# Patient Record
Sex: Female | Born: 1955 | Race: White | Hispanic: No | Marital: Married | Smoking: Former smoker
Health system: Southern US, Community
[De-identification: ages and names within clinical notes are randomized; demographics above are authoritative.]

## PROBLEM LIST (undated history)

## (undated) DIAGNOSIS — F419 Anxiety disorder, unspecified: Secondary | ICD-10-CM

## (undated) DIAGNOSIS — E7800 Pure hypercholesterolemia, unspecified: Secondary | ICD-10-CM

## (undated) DIAGNOSIS — K219 Gastro-esophageal reflux disease without esophagitis: Secondary | ICD-10-CM

## (undated) DIAGNOSIS — G43909 Migraine, unspecified, not intractable, without status migrainosus: Secondary | ICD-10-CM

## (undated) DIAGNOSIS — G4700 Insomnia, unspecified: Secondary | ICD-10-CM

## (undated) DIAGNOSIS — E538 Deficiency of other specified B group vitamins: Secondary | ICD-10-CM

## (undated) DIAGNOSIS — R739 Hyperglycemia, unspecified: Secondary | ICD-10-CM

## (undated) DIAGNOSIS — F32A Depression, unspecified: Secondary | ICD-10-CM

## (undated) DIAGNOSIS — E119 Type 2 diabetes mellitus without complications: Secondary | ICD-10-CM

## (undated) DIAGNOSIS — F329 Major depressive disorder, single episode, unspecified: Secondary | ICD-10-CM

## (undated) DIAGNOSIS — A6000 Herpesviral infection of urogenital system, unspecified: Secondary | ICD-10-CM

## (undated) HISTORY — PX: CHOLECYSTECTOMY: SHX55

## (undated) HISTORY — PX: TUBAL LIGATION: SHX77

## (undated) HISTORY — PX: TONSILLECTOMY: SUR1361

## (undated) HISTORY — PX: APPENDECTOMY: SHX54

---

## 2004-07-01 ENCOUNTER — Other Ambulatory Visit: Admission: RE | Admit: 2004-07-01 | Discharge: 2004-07-01 | Payer: Self-pay | Admitting: Family Medicine

## 2004-09-05 ENCOUNTER — Encounter: Admission: RE | Admit: 2004-09-05 | Discharge: 2004-10-12 | Payer: Self-pay | Admitting: Orthopedic Surgery

## 2005-10-09 ENCOUNTER — Emergency Department: Payer: Self-pay | Admitting: Emergency Medicine

## 2006-10-01 ENCOUNTER — Other Ambulatory Visit: Admission: RE | Admit: 2006-10-01 | Discharge: 2006-10-01 | Payer: Self-pay | Admitting: Family Medicine

## 2006-10-15 ENCOUNTER — Emergency Department: Payer: Self-pay | Admitting: General Practice

## 2007-09-17 ENCOUNTER — Encounter: Admission: RE | Admit: 2007-09-17 | Discharge: 2007-09-17 | Payer: Self-pay | Admitting: Family Medicine

## 2007-10-14 ENCOUNTER — Other Ambulatory Visit: Admission: RE | Admit: 2007-10-14 | Discharge: 2007-10-14 | Payer: Self-pay | Admitting: Family Medicine

## 2008-08-20 ENCOUNTER — Ambulatory Visit: Payer: Self-pay | Admitting: Family Medicine

## 2008-10-19 ENCOUNTER — Ambulatory Visit: Payer: Self-pay | Admitting: Nurse Practitioner

## 2009-01-29 ENCOUNTER — Emergency Department: Payer: Self-pay

## 2009-08-24 ENCOUNTER — Ambulatory Visit: Payer: Self-pay | Admitting: Nurse Practitioner

## 2009-08-26 ENCOUNTER — Ambulatory Visit: Payer: Self-pay | Admitting: Nurse Practitioner

## 2009-10-19 ENCOUNTER — Ambulatory Visit: Payer: Self-pay | Admitting: Nurse Practitioner

## 2010-08-05 ENCOUNTER — Ambulatory Visit: Payer: Self-pay | Admitting: Sports Medicine

## 2010-09-19 ENCOUNTER — Ambulatory Visit: Payer: Self-pay | Admitting: General Practice

## 2010-10-03 ENCOUNTER — Inpatient Hospital Stay: Payer: Self-pay | Admitting: General Practice

## 2010-10-04 LAB — PATHOLOGY REPORT

## 2011-01-17 ENCOUNTER — Ambulatory Visit: Payer: Self-pay | Admitting: Family Medicine

## 2011-02-23 ENCOUNTER — Ambulatory Visit: Payer: Self-pay | Admitting: Gastroenterology

## 2018-08-31 ENCOUNTER — Encounter: Payer: Self-pay | Admitting: Gynecology

## 2018-08-31 ENCOUNTER — Ambulatory Visit (INDEPENDENT_AMBULATORY_CARE_PROVIDER_SITE_OTHER): Payer: Managed Care, Other (non HMO)

## 2018-08-31 ENCOUNTER — Other Ambulatory Visit: Payer: Self-pay

## 2018-08-31 ENCOUNTER — Ambulatory Visit
Admission: EM | Admit: 2018-08-31 | Discharge: 2018-08-31 | Disposition: A | Discharge status: Home / Self Care | Payer: Managed Care, Other (non HMO) | Attending: Family Medicine | Admitting: Family Medicine

## 2018-08-31 DIAGNOSIS — R220 Localized swelling, mass and lump, head: Secondary | ICD-10-CM

## 2018-08-31 DIAGNOSIS — W01118A Fall on same level from slipping, tripping and stumbling with subsequent striking against other sharp object, initial encounter: Secondary | ICD-10-CM

## 2018-08-31 DIAGNOSIS — Z23 Encounter for immunization: Secondary | ICD-10-CM | POA: Diagnosis not present

## 2018-08-31 DIAGNOSIS — S0990XA Unspecified injury of head, initial encounter: Secondary | ICD-10-CM

## 2018-08-31 DIAGNOSIS — S0181XA Laceration without foreign body of other part of head, initial encounter: Secondary | ICD-10-CM | POA: Diagnosis not present

## 2018-08-31 DIAGNOSIS — S0083XA Contusion of other part of head, initial encounter: Secondary | ICD-10-CM

## 2018-08-31 HISTORY — DX: Major depressive disorder, single episode, unspecified: F32.9

## 2018-08-31 HISTORY — DX: Deficiency of other specified B group vitamins: E53.8

## 2018-08-31 HISTORY — DX: Gastro-esophageal reflux disease without esophagitis: K21.9

## 2018-08-31 HISTORY — DX: Type 2 diabetes mellitus without complications: E11.9

## 2018-08-31 HISTORY — DX: Insomnia, unspecified: G47.00

## 2018-08-31 HISTORY — DX: Pure hypercholesterolemia, unspecified: E78.00

## 2018-08-31 HISTORY — DX: Herpesviral infection of urogenital system, unspecified: A60.00

## 2018-08-31 HISTORY — DX: Anxiety disorder, unspecified: F41.9

## 2018-08-31 HISTORY — DX: Hyperglycemia, unspecified: R73.9

## 2018-08-31 HISTORY — DX: Migraine, unspecified, not intractable, without status migrainosus: G43.909

## 2018-08-31 HISTORY — DX: Depression, unspecified: F32.A

## 2018-08-31 MED ORDER — CEPHALEXIN 500 MG PO CAPS: 500.0000 mg | ORAL_CAPSULE | Freq: Three times a day (TID) | ORAL | 0 refills | Status: AC

## 2018-08-31 MED ORDER — LIDOCAINE-EPINEPHRINE (PF) 1 %-1:200000 IJ SOLN: 10.0000 mL | Freq: Once | INTRAMUSCULAR | Status: DC

## 2018-08-31 MED ORDER — MUPIROCIN 2 % EX OINT: TOPICAL_OINTMENT | CUTANEOUS | 0 refills | Status: AC

## 2018-08-31 MED ORDER — TETANUS-DIPHTH-ACELL PERTUSSIS 5-2.5-18.5 LF-MCG/0.5 IM SUSP
0.5000 mL | Freq: Once | INTRAMUSCULAR | Status: AC
  Administered 2018-08-31: 0.5 mL via INTRAMUSCULAR

## 2018-08-31 NOTE — Discharge Instructions (Signed)
Take medication as prescribed. Rest. Drink plenty of fluids. Keep clean.   Suture removal in 7 days.  Follow up with your primary care physician this week as needed. Return to Urgent care for new or worsening concerns.

## 2018-08-31 NOTE — ED Provider Notes (Signed)
[facility] ____________________________________________  Time seen: Approximately 12:27 PM  I have reviewed the triage vital signs and the nursing notes.   HISTORY  Chief Complaint Head Laceration   HPI Betty Barajas is a 62 y.o. female presenting for evaluation of right forehead laceration and headache post head trauma that occurred just prior to arrival.  Patient reports that she was outside doing yard work, but accidentally tripped over a root in the yard causing her to fall forward.  States that the shovel that was sitting in the grass was sitting with blade up, and states that she struck her forehead on the blade.  Denies loss of consciousness.  Reports that she has had a headache 6-7 out of 10 since.  No accompanying vision changes, dizziness, syncope, near-syncope, vomiting, neck pain, back pain, extremity pain or injuries, chest pain, shortness of breath or other complaints.  States that she fell only because of a tripping injury.  States last tetanus immunization approximately 4 years ago.  No medicine taken prior to arrival.  Denies other aggravating alleviating factors.  Reports otherwise feels well. Denies recent sickness. Denies recent antibiotic use.   Rayetta HumphreyGeorge, Sionne A, MD: PCP   Past Medical History:  Diagnosis Date  . Anxiety   . B12 deficiency   . Depression   . Diabetes mellitus without complication (HCC)   . GERD (gastroesophageal reflux disease)   . Herpes genitalis   . Hypercholesteremia   . Hyperglycemia   . Insomnia   . Migraine     There are no active problems to display for this patient.   Past Surgical History:  Procedure Laterality Date  . APPENDECTOMY    . CESAREAN SECTION    . CHOLECYSTECTOMY    . TONSILLECTOMY    . TUBAL LIGATION        Current Facility-Administered Medications:  .  lidocaine-EPINEPHrine (XYLOCAINE-EPINEPHrine) 1 %-1:200000 (PF) injection 10 mL, 10 mL, Other, Once, Renford DillsMiller, Bluford Sedler, NP  Current Outpatient  Medications:  .  buPROPion (WELLBUTRIN XL) 300 MG 24 hr tablet, Take by mouth., Disp: , Rfl:  .  fenofibrate (TRICOR) 48 MG tablet, Take by mouth., Disp: , Rfl:  .  Meclizine HCl 25 MG CHEW, Chew by mouth., Disp: , Rfl:  .  metFORMIN (GLUCOPHAGE) 500 MG tablet, Take by mouth., Disp: , Rfl:  .  sertraline (ZOLOFT) 100 MG tablet, Take by mouth., Disp: , Rfl:  .  SUMAtriptan (IMITREX) 100 MG tablet, TAKE 1 TABLET ONCE AS NEEDED FOR MIGRAINE FOR UP TO 1 DOSE, MAY TAKE A SECOND DOSE AFTER 2 HOURS IF NEEDED, Disp: , Rfl:  .  valACYclovir (VALTREX) 1000 MG tablet, Take by mouth., Disp: , Rfl:  .  zolpidem (AMBIEN) 5 MG tablet, Take by mouth., Disp: , Rfl:  .  cephALEXin (KEFLEX) 500 MG capsule, Take 1 capsule (500 mg total) by mouth 3 (three) times daily for 7 days., Disp: 21 capsule, Rfl: 0 .  mupirocin ointment (BACTROBAN) 2 %, Apply twice times a day for 7 days., Disp: 22 g, Rfl: 0  Allergies Lovastatin; Baclofen; and Codeine  Family History  Adopted: Yes    Social History Social History   Tobacco Use  . Smoking status: Former Games developermoker  . Smokeless tobacco: Never Used  Substance Use Topics  . Alcohol use: Yes  . Drug use: Never    Review of Systems Constitutional: No fever/chills Eyes: No visual changes. Cardiovascular: Denies chest pain. Respiratory: Denies shortness of breath. Gastrointestinal: No abdominal pain.  No nausea, no vomiting.   Musculoskeletal: Negative for back pain. Skin: Positive laceration. Neurological: Negative for  focal weakness or numbness.  Positive headache.   ____________________________________________   PHYSICAL EXAM:  VITAL SIGNS: ED Triage Vitals  Enc Vitals Group     BP 08/31/18 1154 (!) 141/87     Pulse Rate 08/31/18 1154 74     Resp 08/31/18 1154 16     Temp 08/31/18 1154 98.3 F (36.8 C)     Temp Source 08/31/18 1154 Oral     SpO2 08/31/18 1154 96 %     Weight 08/31/18 1155 161 lb (73 kg)     Height 08/31/18 1155 5\' 6"  (1.676 m)      Head Circumference --      Peak Flow --      Pain Score 08/31/18 1155 8     Pain Loc --      Pain Edu? --      Excl. in GC? --     Constitutional: Alert and oriented. Well appearing and in no acute distress. Eyes: Conjunctivae are normal. PERRL. EOMI. no pain with EOMs. ENT      Head: see skin below.      Nose: No congestion/rhinnorhea.      Mouth/Throat: Mucous membranes are moist.Oropharynx non-erythematous.  No dental injury noted. Neck: No stridor. Supple without meningismus.  Hematological/Lymphatic/Immunilogical: No cervical lymphadenopathy. Cardiovascular: Normal rate, regular rhythm. Grossly normal heart sounds.  Good peripheral circulation. Respiratory: Normal respiratory effort without tachypnea nor retractions. Breath sounds are clear and equal bilaterally. No wheezes, rales, rhonchi. Musculoskeletal:  Nontender with normal range of motion in all extremities. No midline cervical, thoracic or lumbar tenderness to palpation. Neurologic:  Normal speech and language. No gross focal neurologic deficits are appreciated. Speech is normal. No gait instability.  No paresthesias.  Steady gait. Skin:  Skin is warm, dry and intact. No rash noted. Except:   3 cm laceration, with 1 cm laceration on each bottom side as depicted above, mild to moderate tenderness to direct palpation, no further surrounding tenderness, no periorbital tenderness, no facial tenderness, mild active bleeding, no foreign body noted, no galeal laceration noted.   Post repair  Psychiatric: Mood and affect are normal. Speech and behavior are normal. Patient exhibits appropriate insight and judgment   ___________________________________________   LABS (all labs ordered are listed, but only abnormal results are displayed)  Labs Reviewed - No data to display  RADIOLOGY  Ct Head Wo Contrast  Result Date: 08/31/2018 CLINICAL DATA:  62 year old female with acute head injury with headache and laceration above  the RIGHT eye. EXAM: CT HEAD WITHOUT CONTRAST TECHNIQUE: Contiguous axial images were obtained from the base of the skull through the vertex without intravenous contrast. COMPARISON:  10/15/2006 CT FINDINGS: Brain: No evidence of acute infarction, hemorrhage, hydrocephalus, extra-axial collection or mass lesion/mass effect. Vascular: Mild atherosclerotic calcifications noted. Skull: Normal. Negative for fracture or focal lesion. Sinuses/Orbits: No acute finding. Other: RIGHT forehead soft tissue swelling noted. IMPRESSION: 1. No evidence of acute intracranial abnormality. 2. RIGHT forehead soft tissue swelling without fracture. Electronically Signed   By: Harmon Pier M.D.   On: 08/31/2018 12:50   ____________________________________________   PROCEDURES Procedures   Procedure(s) performed:  Procedure explained and verbal consent obtained. Consent: Verbal consent obtained. Written consent not obtained. Risks and benefits: risks, benefits and alternatives were discussed Patient identity confirmed: verbally with patient and hospital-assigned identification number  Consent given by: patient   Laceration Repair Location:right forehead  Length: 5 cm total Foreign bodies: no foreign bodies Tendon involvement: none Nerve involvement: none Preparation: Patient was prepped and draped in the usual sterile fashion. Anesthesia with topical LET, 1% Lidocaine with epi 4 mls Cleaned with Betadine. Irrigation solution: saline Irrigation method: jet lavage Amount of cleaning: copious Repaired with 6-0 nylon Number of sutures: 9 Technique: simple interrupted  Approximation: loose Patient tolerate well. Wound well approximated post repair.  Antibiotic ointment and dressing applied.  Wound care instructions provided.  Observe for any signs of infection or other problems.      INITIAL IMPRESSION / ASSESSMENT AND PLAN / ED COURSE  Pertinent labs & imaging results that were available during my care of  the patient were reviewed by me and considered in my medical decision making (see chart for details).  Well-appearing patient.  No focal neurological deficit.  Due to mechanism, CT head evaluated, CT head as above per radiologist, no evidence of acute intracranial abnormality, right forehead soft tissue swelling without fracture.  Laceration copiously cleaned and irrigated and repaired as above.  Due to dirty object, Tdap updated.  Patient also "prediabetic "and due to mechanism will empirically also treat with oral Keflex and topical Bactroban.  Encouraged ice, over-the-counter Tylenol, rest and supportive care.  Suture removal in 7 days.  Return to urgent care or primary care for suture removal.  Discussed sooner return parameters and wound care.Discussed indication, risks and benefits of medications with patient.  Discussed follow up with Primary care physician this week. Discussed follow up and return parameters including no resolution or any worsening concerns. Patient verbalized understanding and agreed to plan.   ____________________________________________   FINAL CLINICAL IMPRESSION(S) / ED DIAGNOSES  Final diagnoses:  Laceration of forehead, initial encounter  Traumatic hematoma of forehead, initial encounter     ED Discharge Orders         Ordered    cephALEXin (KEFLEX) 500 MG capsule  3 times daily     08/31/18 1401    mupirocin ointment (BACTROBAN) 2 %     08/31/18 1401           Note: This dictation was prepared with Dragon dictation along with smaller phrase technology. Any transcriptional errors that result from this process are unintentional.         Renford Dills, NP 08/31/18 1624

## 2018-08-31 NOTE — ED Triage Notes (Signed)
Per patient fell in her back yard x today when her forehead it on the shovel. Patient denies loss of consciousness. Per patient with headache and laceration to her forehead

## 2018-10-24 ENCOUNTER — Other Ambulatory Visit: Payer: Self-pay | Admitting: Family Medicine

## 2018-10-24 DIAGNOSIS — Z1231 Encounter for screening mammogram for malignant neoplasm of breast: Secondary | ICD-10-CM

## 2018-12-16 ENCOUNTER — Encounter (INDEPENDENT_AMBULATORY_CARE_PROVIDER_SITE_OTHER): Payer: Self-pay

## 2018-12-16 ENCOUNTER — Ambulatory Visit
Admission: RE | Admit: 2018-12-16 | Discharge: 2018-12-16 | Disposition: A | Discharge status: Home / Self Care | Payer: Managed Care, Other (non HMO) | Source: Ambulatory Visit | Attending: Family Medicine | Admitting: Family Medicine

## 2018-12-16 DIAGNOSIS — Z1231 Encounter for screening mammogram for malignant neoplasm of breast: Secondary | ICD-10-CM | POA: Diagnosis present

## 2020-09-22 LAB — EXTERNAL GENERIC LAB PROCEDURE: COLOGUARD: NEGATIVE

## 2020-12-13 ENCOUNTER — Other Ambulatory Visit: Payer: Self-pay | Admitting: Family Medicine

## 2020-12-13 DIAGNOSIS — Z1231 Encounter for screening mammogram for malignant neoplasm of breast: Secondary | ICD-10-CM

## 2021-09-29 ENCOUNTER — Ambulatory Visit: Payer: Managed Care, Other (non HMO)

## 2022-01-04 ENCOUNTER — Other Ambulatory Visit: Payer: Self-pay | Admitting: Family Medicine

## 2022-01-04 ENCOUNTER — Other Ambulatory Visit: Payer: Self-pay

## 2022-01-04 ENCOUNTER — Ambulatory Visit
Admission: RE | Admit: 2022-01-04 | Discharge: 2022-01-04 | Disposition: A | Discharge status: Home / Self Care | Payer: 59 | Source: Ambulatory Visit | Attending: Family Medicine | Admitting: Family Medicine

## 2022-01-04 DIAGNOSIS — Z1231 Encounter for screening mammogram for malignant neoplasm of breast: Secondary | ICD-10-CM | POA: Insufficient documentation

## 2022-09-09 IMAGING — MG MM DIGITAL SCREENING BILAT W/ TOMO AND CAD
8 series · 8 of 24 positions shown · non-contrast
Comparison: Previous exam(s).

CLINICAL DATA: Screening.

EXAM:
DIGITAL SCREENING BILATERAL MAMMOGRAM WITH TOMOSYNTHESIS AND CAD
TECHNIQUE: Bilateral screening digital craniocaudal and mediolateral oblique
mammograms were obtained. Bilateral screening digital breast
tomosynthesis was performed. The images were evaluated with
computer-aided detection.

[R CC synth-2D]
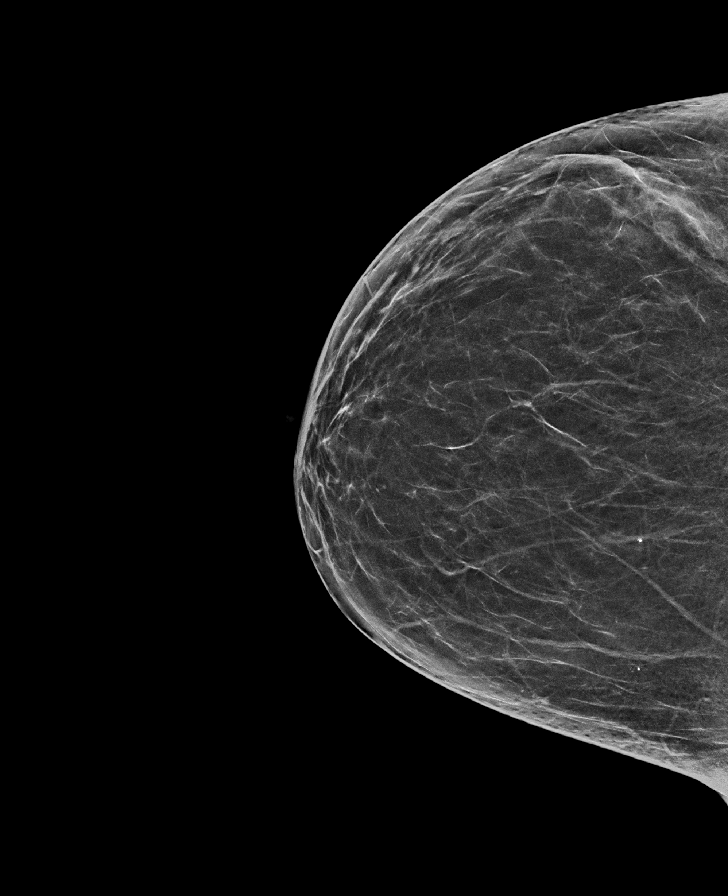

[L CC synth-2D]
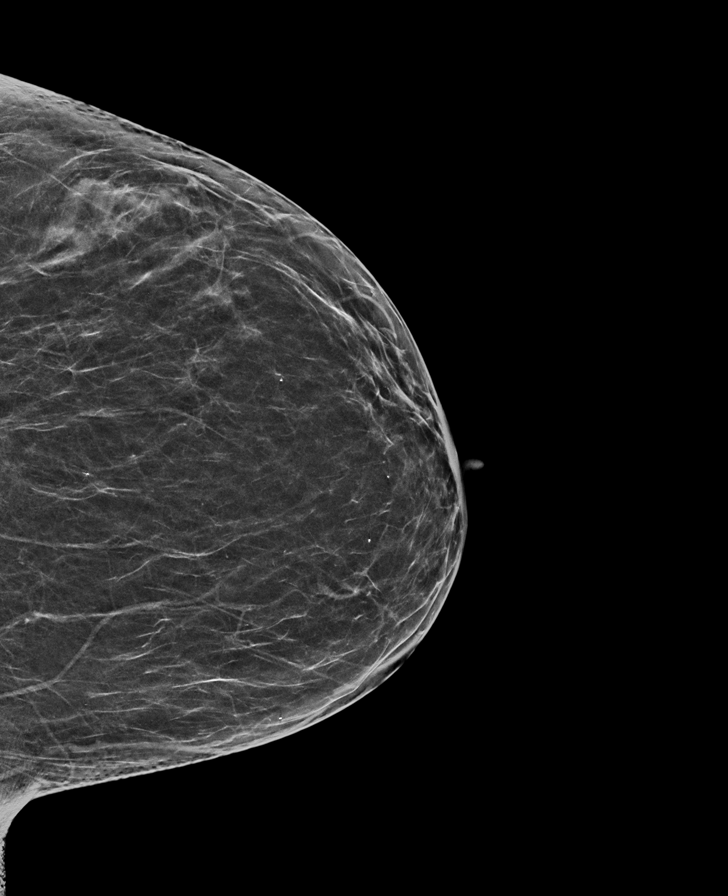

[R MLO synth-2D]
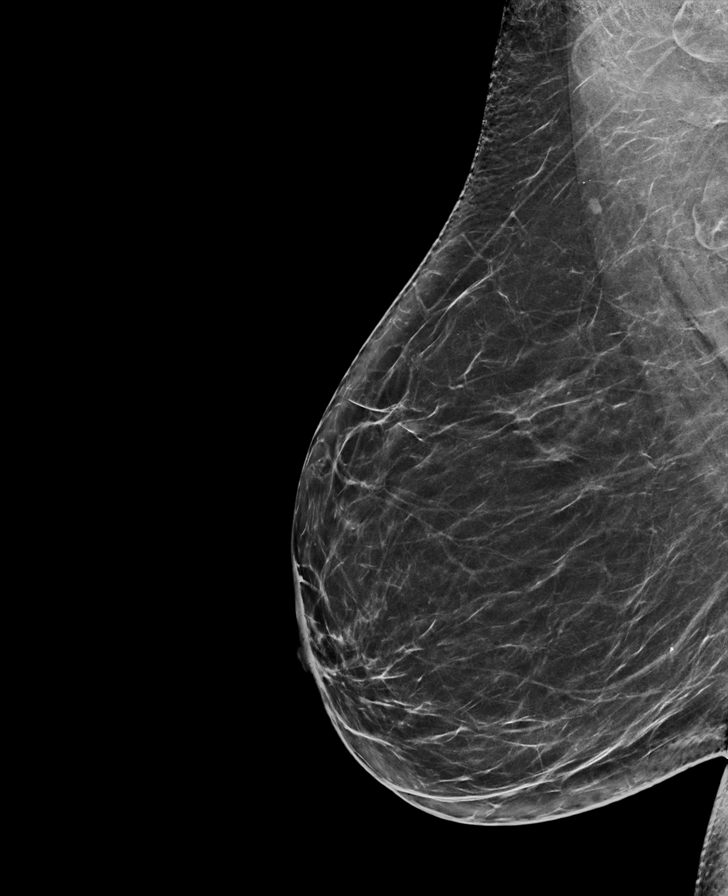

[L MLO synth-2D]
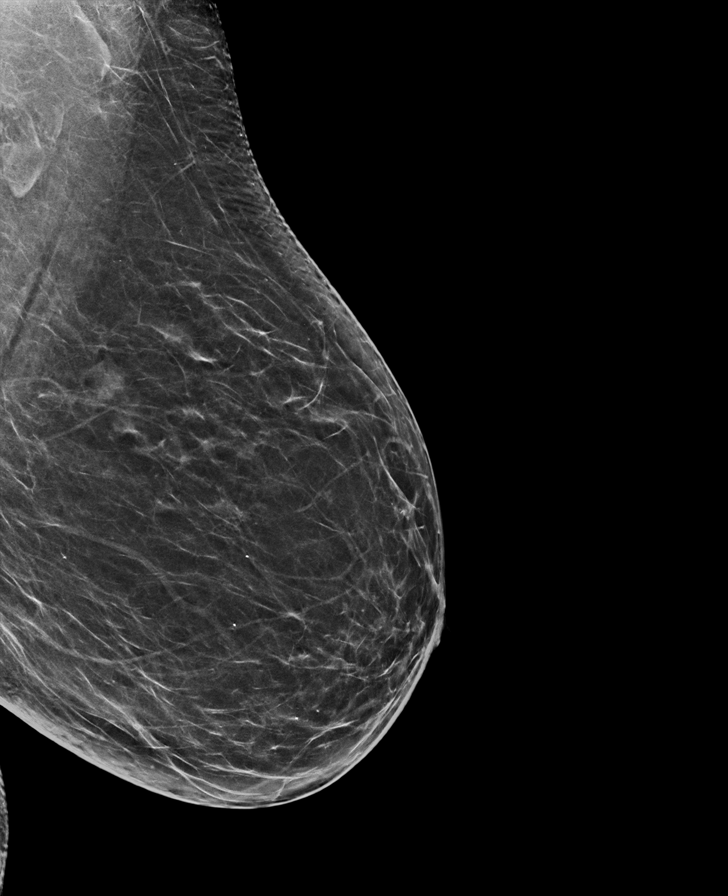

[R CC tomo · tomo slice 33/64.0]
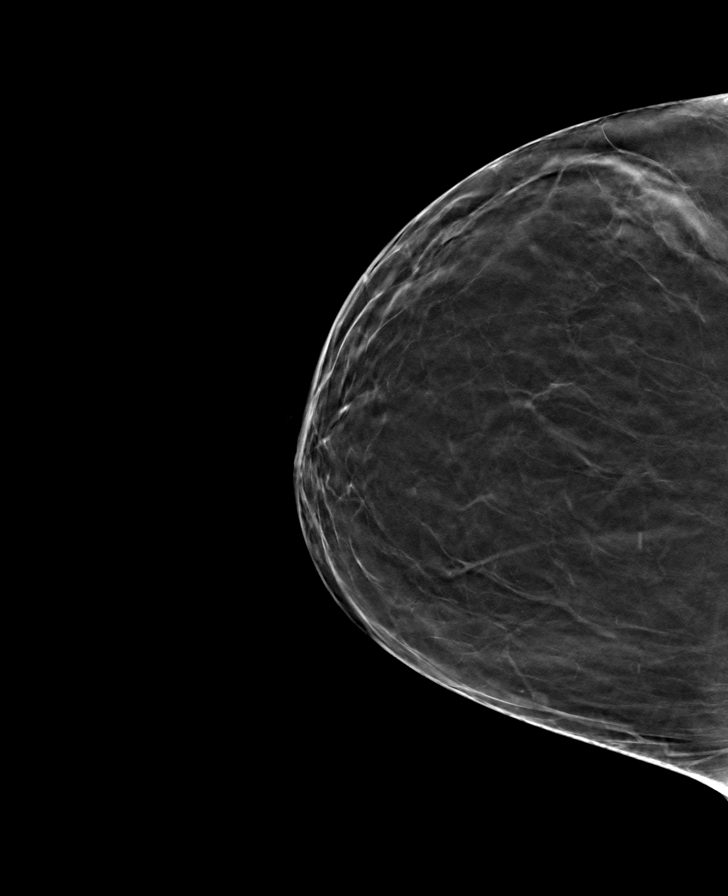

[L CC tomo · tomo slice 31/60.0]
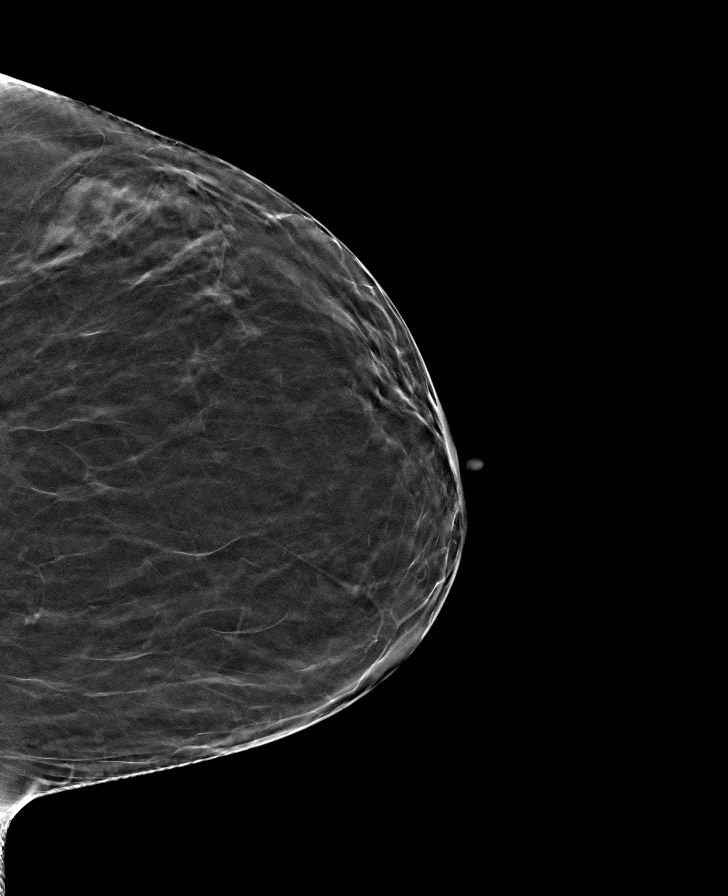

[R MLO tomo · tomo slice 37/73.0]
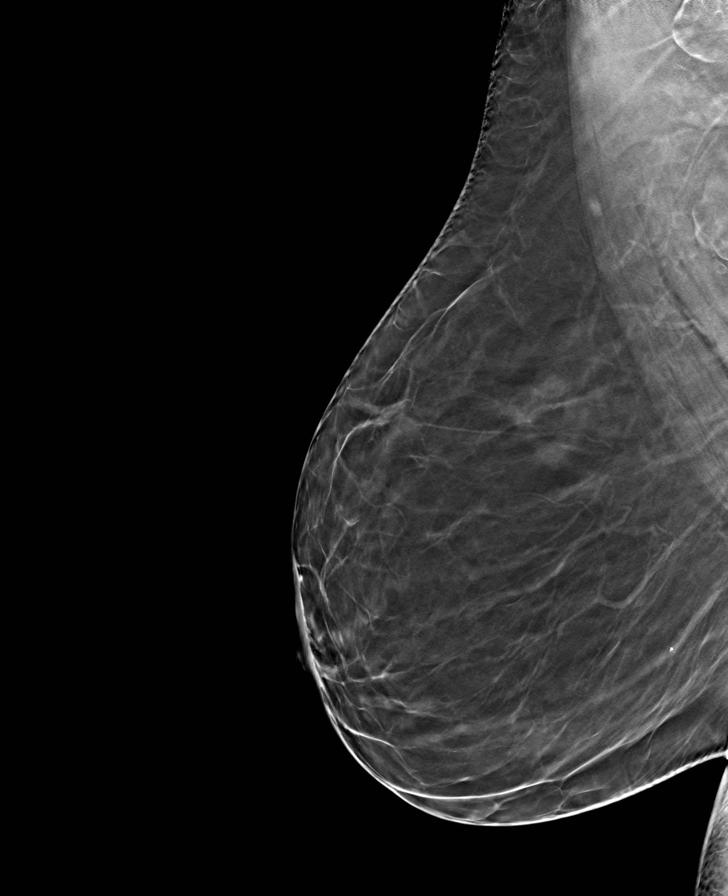

[L MLO tomo · tomo slice 36/71.0]
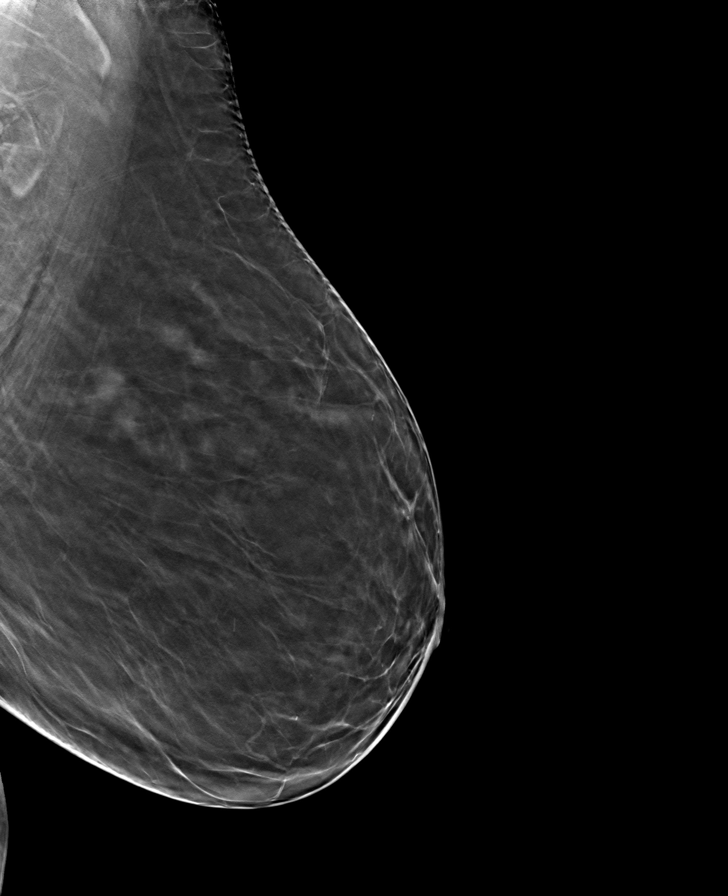

[8 of 24 positions shown; findings below may reference images not displayed]

ACR Breast Density Category b: There are scattered areas of
fibroglandular density.
FINDINGS: There are no findings suspicious for malignancy.
IMPRESSION: No mammographic evidence of malignancy. A result letter of this
screening mammogram will be mailed directly to the patient.

RECOMMENDATION:
Screening mammogram in one year. (Code:51-O-LD2)

BI-RADS CATEGORY  1: Negative.

## 2024-01-21 ENCOUNTER — Other Ambulatory Visit: Payer: Self-pay | Admitting: Family Medicine

## 2024-01-21 DIAGNOSIS — Z1231 Encounter for screening mammogram for malignant neoplasm of breast: Secondary | ICD-10-CM

## 2024-01-30 ENCOUNTER — Inpatient Hospital Stay: Admission: RE | Admit: 2024-01-30 | Payer: 59 | Source: Ambulatory Visit

## 2024-02-06 ENCOUNTER — Ambulatory Visit
Admission: RE | Admit: 2024-02-06 | Discharge: 2024-02-06 | Disposition: A | Discharge status: Home / Self Care | Payer: Self-pay | Source: Ambulatory Visit | Attending: Family Medicine | Admitting: Family Medicine

## 2024-02-06 DIAGNOSIS — Z1231 Encounter for screening mammogram for malignant neoplasm of breast: Secondary | ICD-10-CM | POA: Diagnosis present
